# Patient Record
Sex: Male | Born: 1995 | Race: Black or African American | Hispanic: No | Marital: Single | Smoking: Never smoker
Health system: Southern US, Community
[De-identification: ages and names within clinical notes are randomized; demographics above are authoritative.]

## PROBLEM LIST (undated history)

## (undated) DIAGNOSIS — L309 Dermatitis, unspecified: Secondary | ICD-10-CM

## (undated) DIAGNOSIS — J45909 Unspecified asthma, uncomplicated: Secondary | ICD-10-CM

---

## 2015-05-17 ENCOUNTER — Encounter (HOSPITAL_COMMUNITY): Payer: Self-pay | Admitting: Vascular Surgery

## 2015-05-17 ENCOUNTER — Emergency Department (HOSPITAL_COMMUNITY): Payer: Self-pay

## 2015-05-17 ENCOUNTER — Emergency Department (HOSPITAL_COMMUNITY)
Admission: EM | Admit: 2015-05-17 | Discharge: 2015-05-17 | Disposition: A | Discharge status: Home / Self Care | Payer: Self-pay | Attending: Emergency Medicine | Admitting: Emergency Medicine

## 2015-05-17 DIAGNOSIS — W2105XA Struck by basketball, initial encounter: Secondary | ICD-10-CM | POA: Insufficient documentation

## 2015-05-17 DIAGNOSIS — J45909 Unspecified asthma, uncomplicated: Secondary | ICD-10-CM | POA: Insufficient documentation

## 2015-05-17 DIAGNOSIS — S93402A Sprain of unspecified ligament of left ankle, initial encounter: Secondary | ICD-10-CM | POA: Insufficient documentation

## 2015-05-17 DIAGNOSIS — Y998 Other external cause status: Secondary | ICD-10-CM | POA: Insufficient documentation

## 2015-05-17 DIAGNOSIS — Y92310 Basketball court as the place of occurrence of the external cause: Secondary | ICD-10-CM | POA: Insufficient documentation

## 2015-05-17 DIAGNOSIS — Y9367 Activity, basketball: Secondary | ICD-10-CM | POA: Insufficient documentation

## 2015-05-17 HISTORY — DX: Unspecified asthma, uncomplicated: J45.909

## 2015-05-17 MED ORDER — NAPROXEN 500 MG PO TABS: 500.0000 mg | ORAL_TABLET | Freq: Two times a day (BID) | ORAL | Status: AC

## 2015-05-17 MED ORDER — IBUPROFEN 400 MG PO TABS
800.0000 mg | ORAL_TABLET | Freq: Once | ORAL | Status: AC
  Administered 2015-05-17: 800 mg via ORAL
  Filled 2015-05-17: qty 2

## 2015-05-17 NOTE — ED Notes (Signed)
Pt stable, ambulatory, states understanding of discharge instructions 

## 2015-05-17 NOTE — Discharge Instructions (Signed)

## 2015-05-17 NOTE — ED Provider Notes (Signed)
CSN: [id number]     Arrival date & time 05/17/15  1438 History  This chart was scribed for non-physician practitioner, [name], PA-C, working with Mancel Bale, MD, by Ronney Lion, ED Scribe. This patient was seen in room TR08C/TR08C and the patient's care was started at 3:46 PM.   Chief Complaint  Patient presents with  . Ankle Pain   The history is provided by the patient. No language interpreter was used.   Corey Patel 19 y.o.male  PCP: No primary care provider on file.  Blood pressure 144/81, pulse 62, temperature 97.9 F (36.6 C), temperature source Oral, resp. rate 16, SpO2 99 %.  SIGNIFICANT PMH: Noncontributory CHIEF COMPLAINT: Left ankle pain  When: Yesterday night How: Pt states he was playing basketball and jumped-he twisted his left ankle on landing. He denies head injury or LOC. Pt states he is only able to ambulate with a limp, secondary to the pain.  Chronicity: New. Pt does note he has "hurt" his ankle playing basketball several times in the past, though never as severe as today. Timing: Constant Location: Left ankle Radiation: N/A Quality and severity: Aching, severe Alleviating factors: Staying still Worsening factors: Movement, touch Treatments tried: Ibuprofen, Ice Associated Symptoms: Left ankle swelling Negative ROS: Confusion, diaphoresis, fever, headache, weakness (general or focal), change of vision,  neck pain, dysphagia, aphagia, chest pain, shortness of breath,  back pain, abdominal pains, nausea, vomiting, diarrhea, rash.  Past Medical History  Diagnosis Date  . Asthma    History reviewed. No pertinent past surgical history. No family history on file. Social History  Substance Use Topics  . Smoking status: Never Smoker   . Smokeless tobacco: Never Used  . Alcohol Use: No    Review of Systems A complete 10 system review of systems was obtained and all systems are negative except as noted in the HPI and PMH.    Allergies  Review of  patient's allergies indicates not on file.  Home Medications   Prior to Admission medications   Medication Sig Start Date End Date Taking? Authorizing Provider  naproxen (NAPROSYN) 500 MG tablet Take 1 tablet (500 mg total) by mouth 2 (two) times daily. 05/17/15   Rivaldo Hineman Neva Seat, PA-C   BP 131/75 mmHg  Pulse 66  Temp(Src) 98.1 F (36.7 C) (Oral)  Resp 17  SpO2 99% Physical Exam  Constitutional: He is oriented to person, place, and time. He appears well-developed and well-nourished. No distress.  HENT:  Head: Normocephalic and atraumatic.  Eyes: Conjunctivae and EOM are normal.  Neck: Neck supple. No tracheal deviation present.  Cardiovascular: Normal rate.   Pulmonary/Chest: Effort normal. No respiratory distress.  Musculoskeletal:       Left ankle: He exhibits decreased range of motion and swelling. He exhibits no ecchymosis, no deformity, no laceration and normal pulse. Tenderness. Lateral malleolus tenderness found. No medial malleolus tenderness found. Achilles tendon normal.  Neurological: He is alert and oriented to person, place, and time.  Skin: Skin is warm and dry.  Psychiatric: He has a normal mood and affect. His behavior is normal.  Nursing note and vitals reviewed.   ED Course  Procedures (including critical care time)  DIAGNOSTIC STUDIES: Oxygen Saturation is 99% on RA, normal by my interpretation.    COORDINATION OF CARE: 3:52 PM - Discussed treatment plan with pt at bedside which includes,  XR is negative for fracture or significant abnormality pt given crutches and ASO as well as referral to orthopedist. Pt verbalized understanding and agreed  to plan.      DG Ankle Complete Left (Final result) Result time: 05/17/15 15:48:23   Final result by Rad Results In Interface (05/17/15 15:48:23)   Narrative:   CLINICAL DATA: Injury to the left ankle. Twisting injury playing basketball with pain.  EXAM: LEFT ANKLE COMPLETE - 3+ VIEW  COMPARISON:  None.  FINDINGS: Lateral soft tissue swelling. The left ankle is located. Negative for fracture. Normal alignment of the ankle.  IMPRESSION: Lateral soft tissue swelling without a fracture.   Electronically Signed By: Richarda Overlie M.D. On: 05/17/2015 15:48        Imaging Review No results found. I have personally reviewed and evaluated these images and lab results as part of my medical decision-making.  MDM   Final diagnoses:  Ankle sprain, left, initial encounter    Medications  ibuprofen (ADVIL,MOTRIN) tablet 800 mg (800 mg Oral Given 05/17/15 1608)    19 y.o.Corey Patel's evaluation in the Emergency Department is complete. It has been determined that no acute conditions requiring further emergency intervention are present at this time. The patient/guardian have been advised of the diagnosis and plan. We have discussed signs and symptoms that warrant return to the ED, such as changes or worsening in symptoms.  Vital signs are stable at discharge. Filed Vitals:   05/17/15 1607  BP: 131/75  Pulse: 66  Temp: 98.1 F (36.7 C)  Resp: 17    Patient/guardian has voiced understanding and agreed to follow-up with the PCP or specialist.   I personally performed the services described in this documentation, which was scribed in my presence. The recorded information has been reviewed and is accurate.     [name], PA-C 05/20/15 2210  Mancel Bale, MD 05/23/15 [phone]

## 2015-05-17 NOTE — ED Notes (Signed)
Pt reports to the ED for eval of left ankle pain that began yesterday. He reports he was playing basketball and he twisted it wrong. Pt reports increased pain with weight bearing. Some swelling still noted. Pt A&OX4, resp e/u, and skin warm and dry. No head injury or other complaint.

## 2015-10-05 ENCOUNTER — Encounter (HOSPITAL_COMMUNITY): Payer: Self-pay | Admitting: Emergency Medicine

## 2015-10-05 ENCOUNTER — Emergency Department (HOSPITAL_COMMUNITY)
Admission: EM | Admit: 2015-10-05 | Discharge: 2015-10-05 | Disposition: A | Discharge status: Home / Self Care | Payer: Self-pay | Attending: Emergency Medicine | Admitting: Emergency Medicine

## 2015-10-05 DIAGNOSIS — J45909 Unspecified asthma, uncomplicated: Secondary | ICD-10-CM | POA: Insufficient documentation

## 2015-10-05 DIAGNOSIS — Z711 Person with feared health complaint in whom no diagnosis is made: Secondary | ICD-10-CM

## 2015-10-05 DIAGNOSIS — Z113 Encounter for screening for infections with a predominantly sexual mode of transmission: Secondary | ICD-10-CM | POA: Insufficient documentation

## 2015-10-05 DIAGNOSIS — L293 Anogenital pruritus, unspecified: Secondary | ICD-10-CM | POA: Insufficient documentation

## 2015-10-05 DIAGNOSIS — Z791 Long term (current) use of non-steroidal anti-inflammatories (NSAID): Secondary | ICD-10-CM | POA: Insufficient documentation

## 2015-10-05 DIAGNOSIS — Z872 Personal history of diseases of the skin and subcutaneous tissue: Secondary | ICD-10-CM | POA: Insufficient documentation

## 2015-10-05 HISTORY — DX: Dermatitis, unspecified: L30.9

## 2015-10-05 MED ORDER — AZITHROMYCIN 250 MG PO TABS
1000.0000 mg | ORAL_TABLET | Freq: Once | ORAL | Status: AC
  Administered 2015-10-05: 1000 mg via ORAL
  Filled 2015-10-05: qty 4

## 2015-10-05 MED ORDER — CEFTRIAXONE SODIUM 250 MG IJ SOLR
250.0000 mg | Freq: Once | INTRAMUSCULAR | Status: AC
  Administered 2015-10-05: 250 mg via INTRAMUSCULAR
  Filled 2015-10-05: qty 250

## 2015-10-05 MED ORDER — STERILE WATER FOR INJECTION IJ SOLN
INTRAMUSCULAR | Status: AC
  Filled 2015-10-05: qty 10

## 2015-10-05 NOTE — ED Notes (Signed)
Patient states he is having itching since he had unprotected sex with someone on 09/25/14.  Patient denies other symptoms.

## 2015-10-05 NOTE — ED Provider Notes (Signed)
CSN: [id number]     Arrival date & time 10/05/15  6440 History  By signing my name below, I, Freida Busman, attest that this documentation has been prepared under the direction and in the presence of non-physician practitioner, Everlene Farrier, PA-C. Electronically Signed: Freida Busman, Scribe. 10/05/2015. 10:35 AM.    Chief Complaint  Patient presents with  . SEXUALLY TRANSMITTED DISEASE    The history is provided by the patient. No language interpreter was used.    HPI Comments:  Corey Patel is a 20 y.o. male who presents to the Emergency Department complaining of mild-moderate penile itching for 3 days. Pt admits to having unprotected sex on 09/26/15. He denies lesions/ rashes to site, discharge from penis, hematuria, dysuria, penile/testictular pain, abdominal pain, nausea and vomiting . He also denies h/o STD.  No alleviating factors noted.  Past Medical History  Diagnosis Date  . Asthma   . Eczema    History reviewed. No pertinent past surgical history. No family history on file. Social History  Substance Use Topics  . Smoking status: Never Smoker   . Smokeless tobacco: Never Used  . Alcohol Use: Yes     Comment: socially    Review of Systems  Constitutional: Negative for fever and chills.  HENT: Negative for mouth sores.   Respiratory: Negative for shortness of breath.   Cardiovascular: Negative for chest pain.  Gastrointestinal: Negative for nausea, vomiting and abdominal pain.  Genitourinary: Negative for dysuria, urgency, frequency, hematuria, flank pain, discharge, penile swelling, scrotal swelling, difficulty urinating, genital sores, penile pain and testicular pain.       + Genital itching  Skin: Negative for color change, rash and wound.  Neurological: Negative for weakness and numbness.    Allergies  Review of patient's allergies indicates no known allergies.  Home Medications   Prior to Admission medications   Medication Sig Start Date End Date Taking?  Authorizing Provider  naproxen (NAPROSYN) 500 MG tablet Take 1 tablet (500 mg total) by mouth 2 (two) times daily. 05/17/15   Tiffany Neva Seat, PA-C   BP 151/77 mmHg  Pulse 64  Temp(Src) 98.6 F (37 C) (Oral)  Resp 16  Ht  (1.854 m)  Wt 77.111 kg  BMI 22.43 kg/m2  SpO2 100% Physical Exam  Constitutional: He appears well-developed and well-nourished. No distress.  Nontoxic appearing.  HENT:  Head: Normocephalic and atraumatic.  Eyes: Right eye exhibits no discharge. Left eye exhibits no discharge.  Pulmonary/Chest: Effort normal. No respiratory distress.  Abdominal: Soft. He exhibits no distension. There is no tenderness. There is no guarding.  Abdomen is soft and nontender to palpation.  Genitourinary: No penile tenderness.  Mild clear discharge from tip of penis; no rashes  Chaperone (scribe) was present for exam which was performed with no discomfort or complications.  No erythema to the shaft of his penis. Scrotal contents is normal. No testicular tenderness to palpation.   Neurological: He is alert. Coordination normal.  Skin: Skin is warm and dry. No rash noted. He is not diaphoretic. No erythema. No pallor.  Psychiatric: He has a normal mood and affect. His behavior is normal.  Nursing note and vitals reviewed.   ED Course  Procedures   DIAGNOSTIC STUDIES:  Oxygen Saturation is 100% on RA, normal by my interpretation.    COORDINATION OF CARE:  10:27 AM Will prophylatically treat for GC/CHl in ED. Discussed treatment plan with pt at bedside and pt agreed to plan.  Labs Review Labs Reviewed  RPR  HIV ANTIBODY (ROUTINE TESTING)  GC/CHLAMYDIA PROBE AMP ([place]) NOT AT [facility]   I have personally reviewed and evaluated these lab results as part of my medical decision-making.   MDM   Meds given in ED:  Medications  cefTRIAXone (ROCEPHIN) injection 250 mg (not administered)  azithromycin (ZITHROMAX) tablet 1,000 mg (not administered)    New  Prescriptions   No medications on file    Final diagnoses:  Concern about STD in male without diagnosis   This  is a 20 y.o. male who presents to the Emergency Department complaining of mild-moderate penile itching for 3 days. Pt admits to having unprotected sex on 09/26/15. He denies lesions/ rashes to site, discharge from penis, hematuria, dysuria, penile/testictular pain, abdominal pain, nausea and vomiting.  On exam the patient is afebrile nontoxic appearing. His abdomen is soft and nontender to palpation. He has no lesions to his genital area. No erythema or or rashes. Patient points to itching at the tip of his penis. He does have some mild clear discharge noted with his penis. No testicular or penile tenderness to palpation. No evidence of any herpes lesions or vesicles. Patient does have penile discharge on exam. We will go ahead and treat with Rocephin and azithromycin for gonorrhea and chlamydia. I advised that gonorrhea, chlamydia, syphilis and HIV testing are pending. I encouraged him to follow-up closely with these test results. I educated on safe sex practices. I advised the patient to follow-up with their primary care provider this week. I advised the patient to return to the emergency department with new or worsening symptoms or new concerns. The patient verbalized understanding and agreement with plan.     I personally performed the services described in this documentation, which was scribed in my presence. The recorded information has been reviewed and is accurate.       Everlene Farrier, PA-C 10/05/15 1036  Mancel Bale, MD 10/05/15 [phone]

## 2015-10-05 NOTE — Discharge Instructions (Signed)
Sexually Transmitted Disease °A sexually transmitted disease (STD) is a disease or infection that may be passed (transmitted) from person to person, usually during sexual activity. This may happen by way of saliva, semen, blood, vaginal mucus, or urine. Common STDs include: °· Gonorrhea. °· Chlamydia. °· Syphilis. °· HIV and AIDS. °· Genital herpes. °· Hepatitis B and C. °· Trichomonas. °· Human papillomavirus (HPV). °· Pubic lice. °· Scabies. °· Mites. °· Bacterial vaginosis. °WHAT ARE CAUSES OF STDs? °An STD may be caused by bacteria, a virus, or parasites. STDs are often transmitted during sexual activity if one person is infected. However, they may also be transmitted through nonsexual means. STDs may be transmitted after:  °· Sexual intercourse with an infected person. °· Sharing sex toys with an infected person. °· Sharing needles with an infected person or using unclean piercing or tattoo needles. °· Having intimate contact with the genitals, mouth, or rectal areas of an infected person. °· Exposure to infected fluids during birth. °WHAT ARE THE SIGNS AND SYMPTOMS OF STDs? °Different STDs have different symptoms. Some people may not have any symptoms. If symptoms are present, they may include: °· Painful or bloody urination. °· Pain in the pelvis, abdomen, vagina, anus, throat, or eyes. °· A skin rash, itching, or irritation. °· Growths, ulcerations, blisters, or sores in the genital and anal areas. °· Abnormal vaginal discharge with or without bad odor. °· Penile discharge in men. °· Fever. °· Pain or bleeding during sexual intercourse. °· Swollen glands in the groin area. °· Yellow skin and eyes (jaundice). This is seen with hepatitis. °· Swollen testicles. °· Infertility. °· Sores and blisters in the mouth. °HOW ARE STDs DIAGNOSED? °To make a diagnosis, your health care provider may: °· Take a medical history. °· Perform a physical exam. °· Take a sample of any discharge to examine. °· Swab the throat,  cervix, opening to the penis, rectum, or vagina for testing. °· Test a sample of your first morning urine. °· Perform blood tests. °· Perform a Pap test, if this applies. °· Perform a colposcopy. °· Perform a laparoscopy. °HOW ARE STDs TREATED? °Treatment depends on the STD. Some STDs may be treated but not cured. °· Chlamydia, gonorrhea, trichomonas, and syphilis can be cured with antibiotic medicine. °· Genital herpes, hepatitis, and HIV can be treated, but not cured, with prescribed medicines. The medicines lessen symptoms. °· Genital warts from HPV can be treated with medicine or by freezing, burning (electrocautery), or surgery. Warts may come back. °· HPV cannot be cured with medicine or surgery. However, abnormal areas may be removed from the cervix, vagina, or vulva. °· If your diagnosis is confirmed, your recent sexual partners need treatment. This is true even if they are symptom-free or have a negative culture or evaluation. They should not have sex until their health care providers say it is okay. °· Your health care provider may test you for infection again 3 months after treatment. °HOW CAN I REDUCE MY RISK OF GETTING AN STD? °Take these steps to reduce your risk of getting an STD: °· Use latex condoms, dental dams, and water-soluble lubricants during sexual activity. Do not use petroleum jelly or oils. °· Avoid having multiple sex partners. °· Do not have sex with someone who has other sex partners °· Do not have sex with anyone you do not know or who is at high risk for an STD. °· Avoid risky sex practices that can break your skin. °· Do not have sex   if you have open sores on your mouth or skin. °· Avoid drinking too much alcohol or taking illegal drugs. Alcohol and drugs can affect your judgment and put you in a vulnerable position. °· Avoid engaging in oral and anal sex acts. °· Get vaccinated for HPV and hepatitis. If you have not received these vaccines in the past, talk to your health care  provider about whether one or both might be right for you. °· If you are at risk of being infected with HIV, it is recommended that you take a prescription medicine daily to prevent HIV infection. This is called pre-exposure prophylaxis (PrEP). You are considered at risk if: °¨ You are a man who has sex with other men (MSM). °¨ You are a heterosexual man or woman and are sexually active with more than one partner. °¨ You take drugs by injection. °¨ You are sexually active with a partner who has HIV. °· Talk with your health care provider about whether you are at high risk of being infected with HIV. If you choose to begin PrEP, you should first be tested for HIV. You should then be tested every 3 months for as long as you are taking PrEP. °WHAT SHOULD I DO IF I THINK I HAVE AN STD? °· See your health care provider. °· Tell your sexual partner(s). They should be tested and treated for any STDs. °· Do not have sex until your health care provider says it is okay. °WHEN SHOULD I GET IMMEDIATE MEDICAL CARE? °Contact your health care provider right away if:  °· You have severe abdominal pain. °· You are a man and notice swelling or pain in your testicles. °· You are a woman and notice swelling or pain in your vagina. °  °This information is not intended to replace advice given to you by your health care provider. Make sure you discuss any questions you have with your health care provider. °  °Document Released: 11/25/2002 Document Revised: 09/25/2014 Document Reviewed: 03/25/2013 °Elsevier Interactive Patient Education ©2016 [employer] ° °

## 2015-10-06 LAB — GC/CHLAMYDIA PROBE AMP (~~LOC~~) NOT AT ARMC
Chlamydia: NEGATIVE
NEISSERIA GONORRHEA: NEGATIVE

## 2015-10-06 LAB — RPR: RPR: NONREACTIVE

## 2015-10-06 LAB — HIV ANTIBODY (ROUTINE TESTING W REFLEX): HIV Screen 4th Generation wRfx: NONREACTIVE

## 2016-08-03 ENCOUNTER — Emergency Department (HOSPITAL_COMMUNITY)
Admission: EM | Admit: 2016-08-03 | Discharge: 2016-08-03 | Disposition: A | Discharge status: Home / Self Care | Payer: BLUE CROSS/BLUE SHIELD | Attending: Emergency Medicine | Admitting: Emergency Medicine

## 2016-08-03 ENCOUNTER — Encounter (HOSPITAL_COMMUNITY): Payer: Self-pay | Admitting: Emergency Medicine

## 2016-08-03 DIAGNOSIS — Z5321 Procedure and treatment not carried out due to patient leaving prior to being seen by health care provider: Secondary | ICD-10-CM | POA: Insufficient documentation

## 2016-08-03 DIAGNOSIS — R300 Dysuria: Secondary | ICD-10-CM | POA: Insufficient documentation

## 2016-08-03 DIAGNOSIS — J45909 Unspecified asthma, uncomplicated: Secondary | ICD-10-CM | POA: Insufficient documentation

## 2016-08-03 LAB — URINALYSIS, ROUTINE W REFLEX MICROSCOPIC
Glucose, UA: NEGATIVE mg/dL
HGB URINE DIPSTICK: NEGATIVE
Ketones, ur: NEGATIVE mg/dL
NITRITE: NEGATIVE
PROTEIN: NEGATIVE mg/dL
SPECIFIC GRAVITY, URINE: 1.024 (ref 1.005–1.030)
pH: 7.5 (ref 5.0–8.0)

## 2016-08-03 LAB — URINE MICROSCOPIC-ADD ON

## 2016-08-03 NOTE — ED Triage Notes (Signed)
Pt was called x4 to go to fast track and for vital signs. No answer. Cannot find pt in ED or restrooms. Will discharge pt

## 2016-08-03 NOTE — ED Triage Notes (Signed)
Pt state he has been having burning with urination since Sunday. Denies any discharge.

## 2016-08-03 NOTE — ED Notes (Signed)
NO ANSWER IN LOBBY

## 2016-08-03 NOTE — ED Triage Notes (Signed)
Called for pt to obtain vitals. No answer.

## 2016-08-03 NOTE — ED Notes (Signed)
Attempted to call patient back to room, no response still.

## 2016-08-03 NOTE — ED Notes (Signed)
Patient called with no answer in main waiting room and sub waiting.  Also checked outside lobby doors.

## 2016-08-24 IMAGING — DX DG ANKLE COMPLETE 3+V*L*
3 series · 3 of 3 positions shown · non-contrast
Comparison: None.

CLINICAL DATA: Injury to the left ankle. Twisting injury playing
basketball with pain.

EXAM:
LEFT ANKLE COMPLETE - 3+ VIEW

[ankle ap]
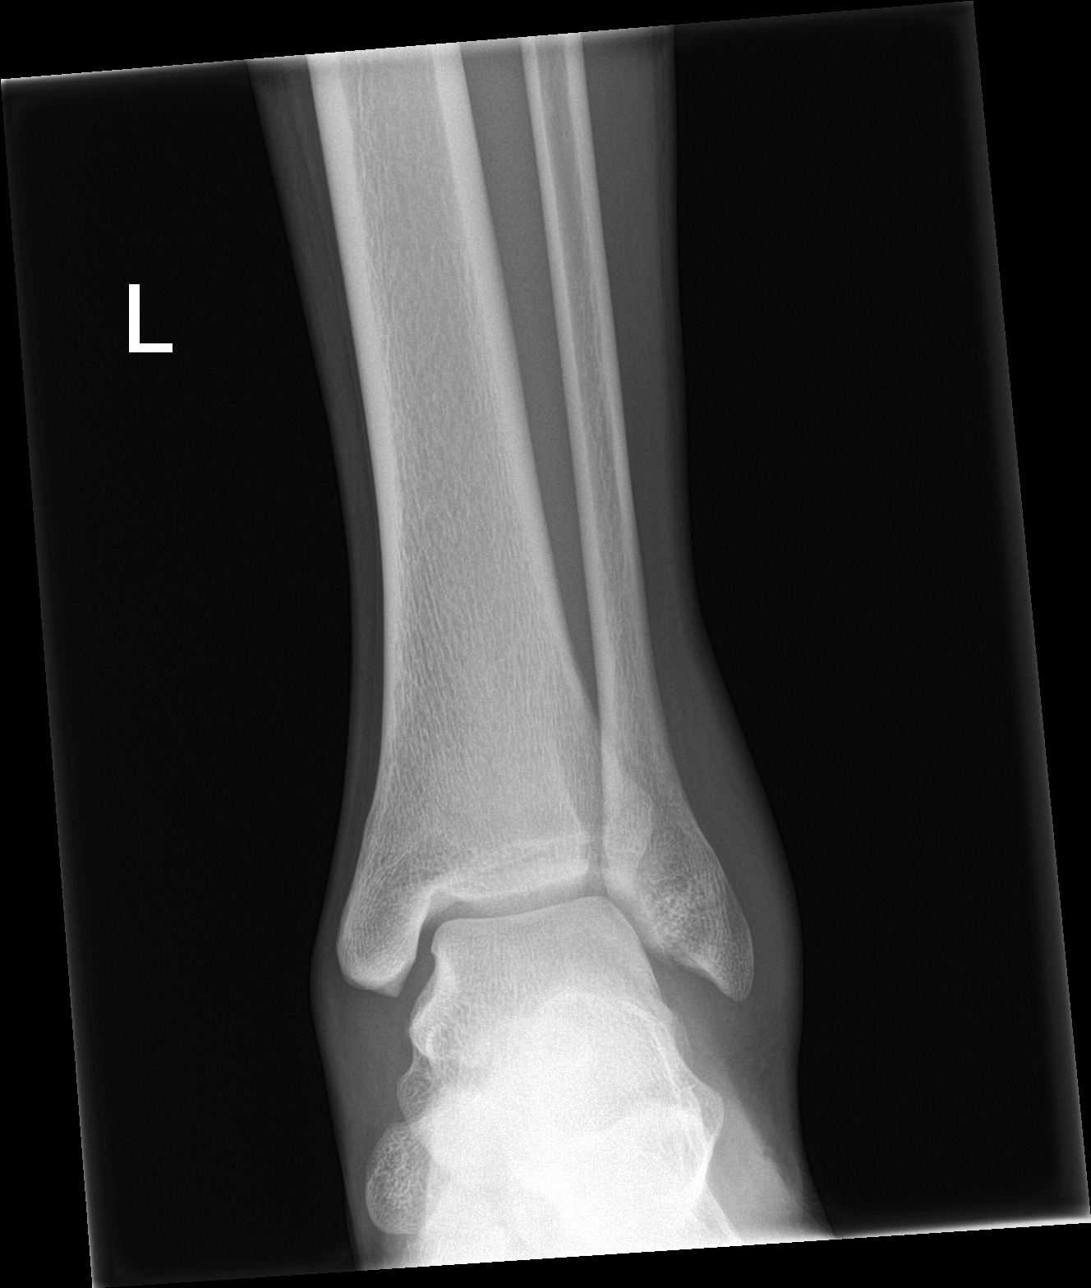

[ankle obl]
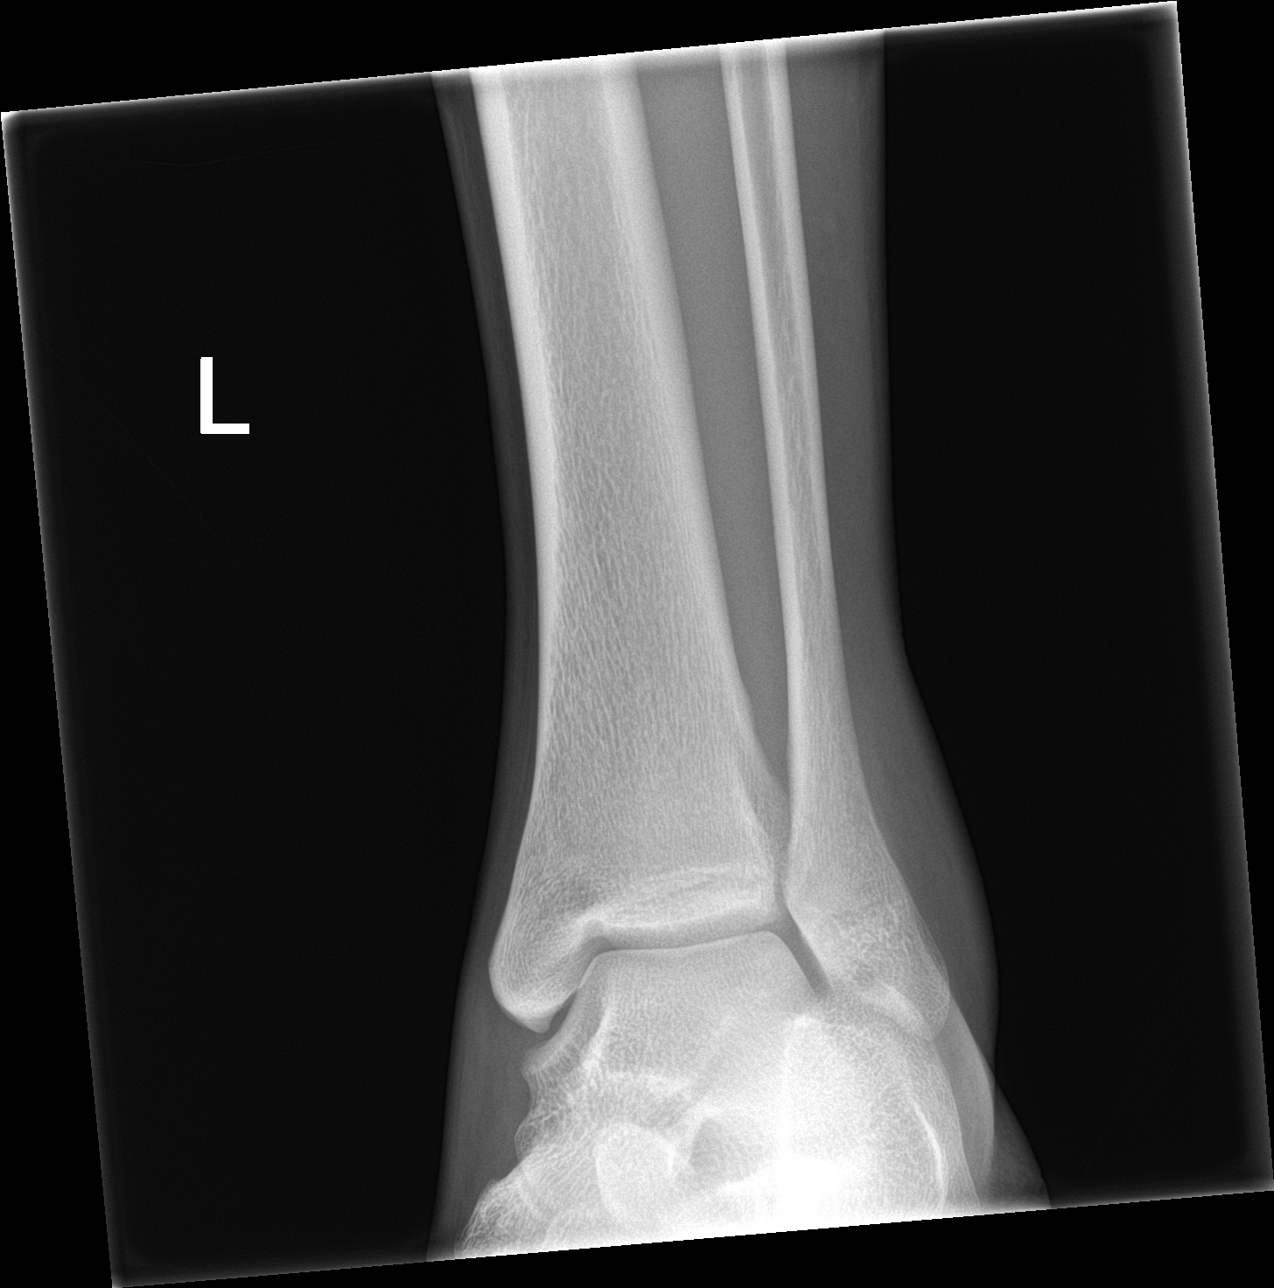

[ankle lat]
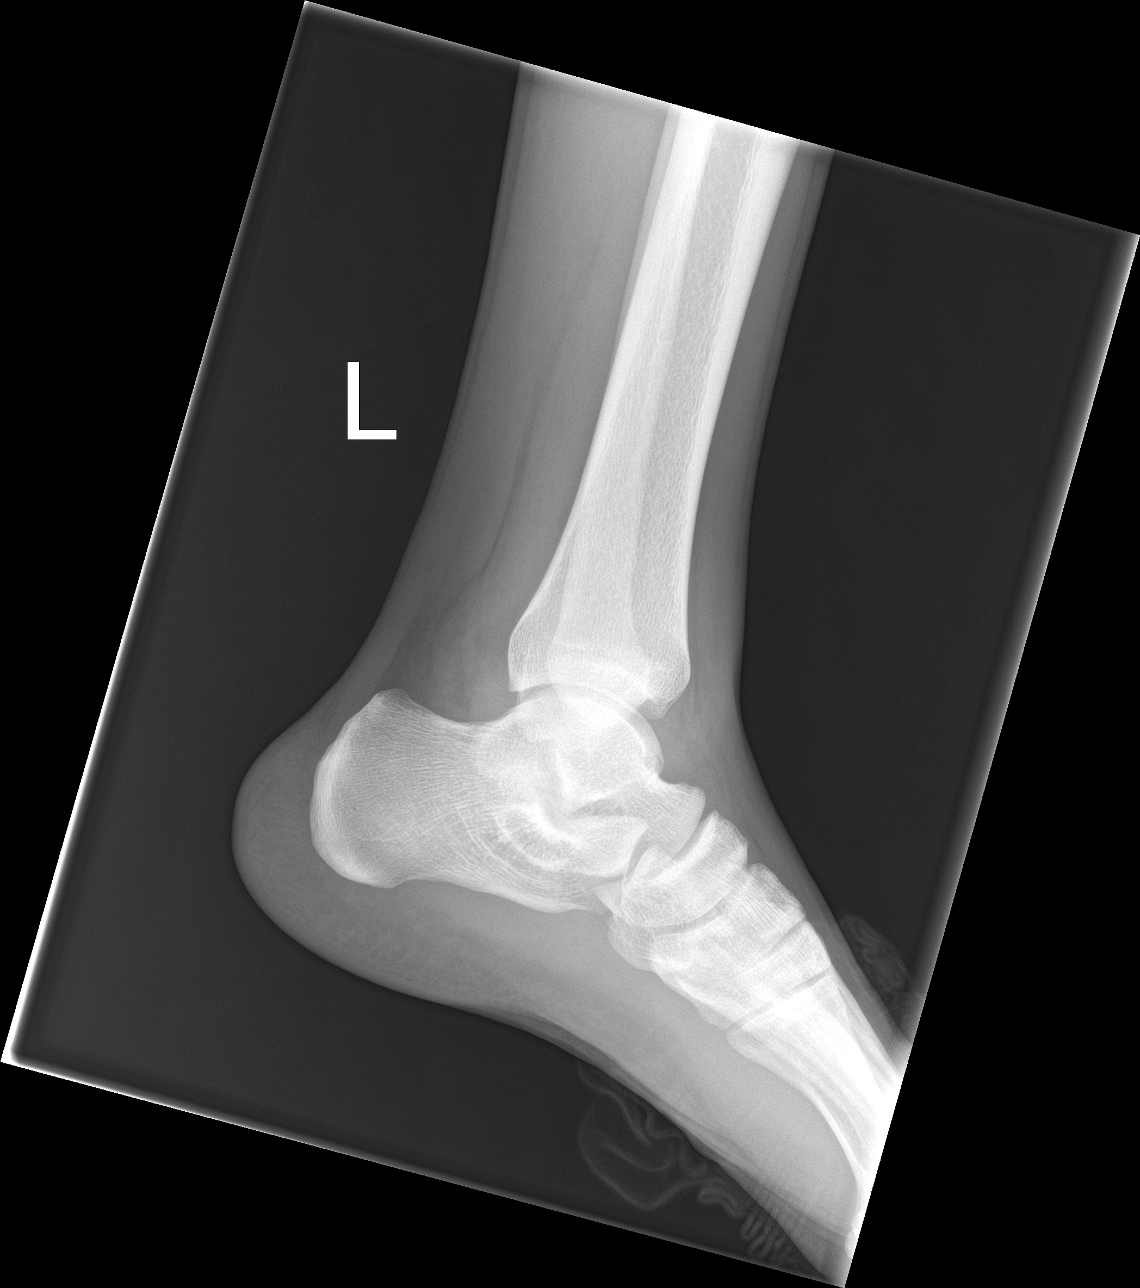

[3 of 3 positions shown; findings below may reference images not displayed]

FINDINGS: Lateral soft tissue swelling. The left ankle is located. Negative
for fracture. Normal alignment of the ankle.
IMPRESSION: Lateral soft tissue swelling without a fracture.

## 2023-03-30 ENCOUNTER — Other Ambulatory Visit (HOSPITAL_COMMUNITY): Payer: Self-pay
